# Patient Record
Sex: Male | Born: 1970 | Race: White | Hispanic: No | Marital: Married | Smoking: Never smoker
Health system: Southern US, Community
[De-identification: ages and names within clinical notes are randomized; demographics above are authoritative.]

## PROBLEM LIST (undated history)

## (undated) DIAGNOSIS — Z87442 Personal history of urinary calculi: Secondary | ICD-10-CM

## (undated) DIAGNOSIS — F988 Other specified behavioral and emotional disorders with onset usually occurring in childhood and adolescence: Secondary | ICD-10-CM

## (undated) HISTORY — PX: NO PAST SURGERIES: SHX2092

---

## 2012-03-20 ENCOUNTER — Ambulatory Visit: Payer: Self-pay | Admitting: Family Medicine

## 2013-01-23 ENCOUNTER — Ambulatory Visit: Payer: Self-pay | Admitting: Family Medicine

## 2014-01-07 IMAGING — CT CT CHEST W/O CM
1 series · 16 of 33 positions shown, 20 images · non-contrast
Comparison: none

REASON FOR EXAM: pulmonary nodule Abn CXR
COMMENTS:

PROCEDURE:     AV - AV CHEST WITHOUT CONTRAST  - March 20, 2012 [DATE]
RESULT:     Cecal pulmonary nodule.
Comparison Study: No recent.

[Series 2: soft tissue · axial · 0.66mm/px · z∈[-628,-304]mm · 16 of 118 slices shown, 20 images]
[im 5/118  mediastinal]
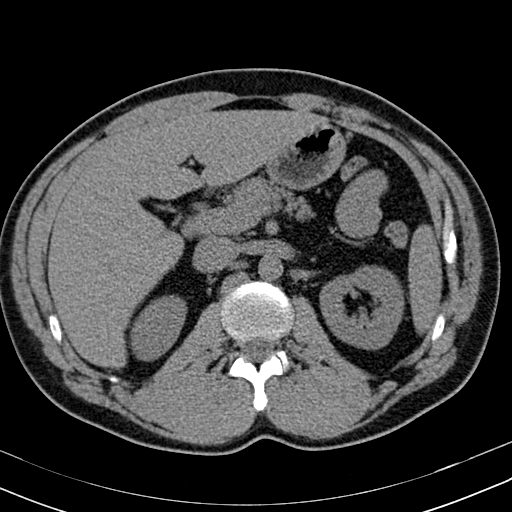
[im 5/118  lung]
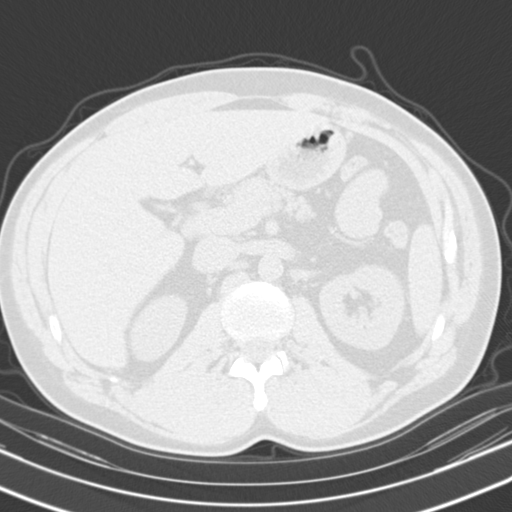
[im 14/118  lung]
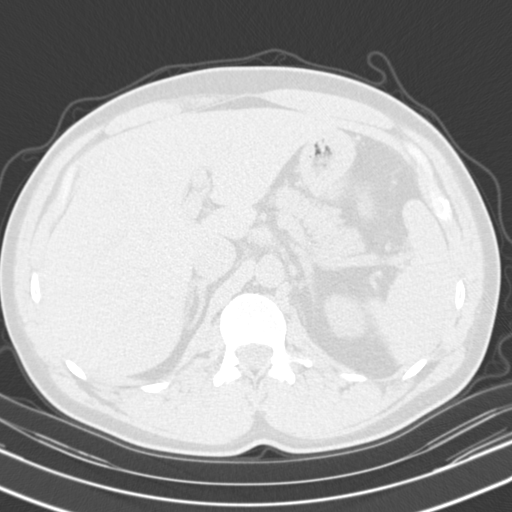
[im 22/118  lung]
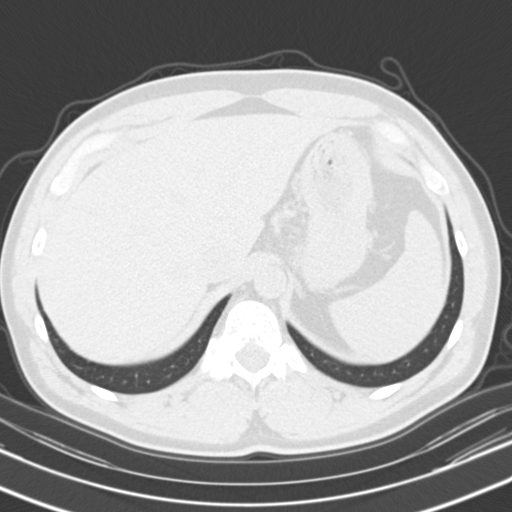
[im 27/118  lung]
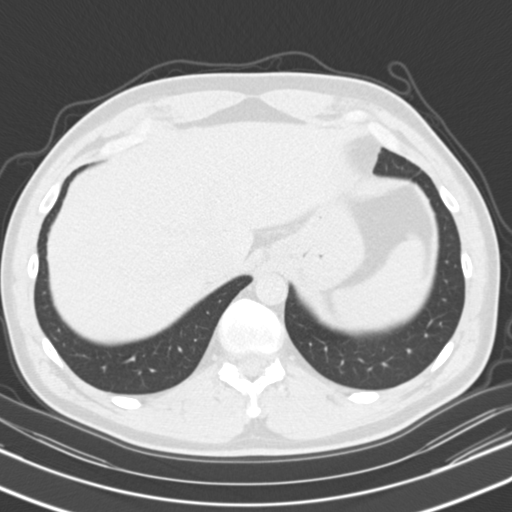
[im 35/118  mediastinal]
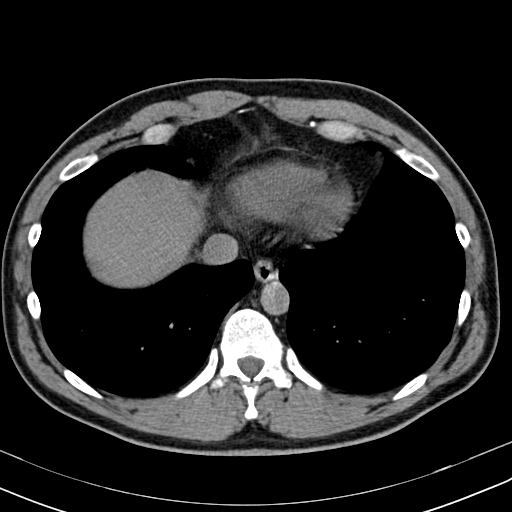
[im 35/118  lung]
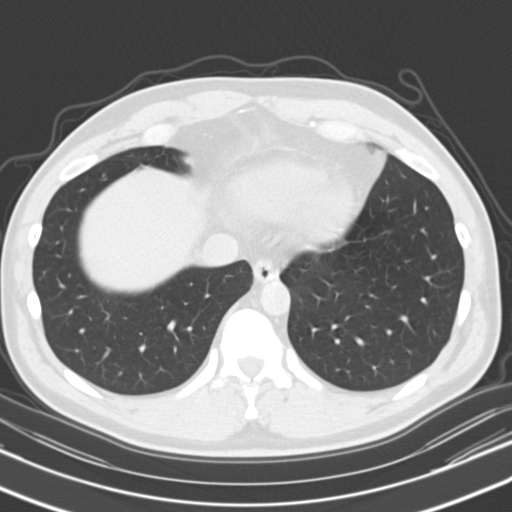
[im 44/118  lung]
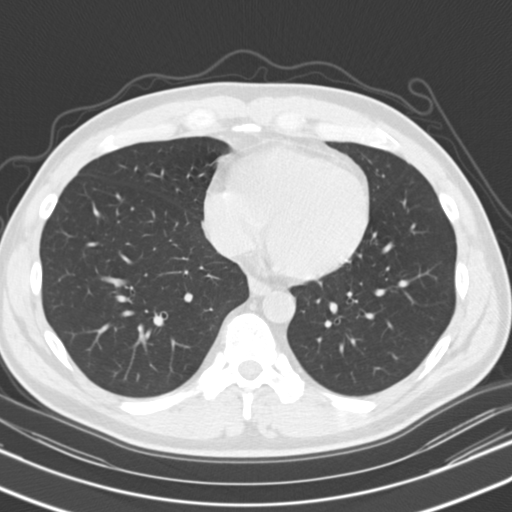
[im 48/118  lung]
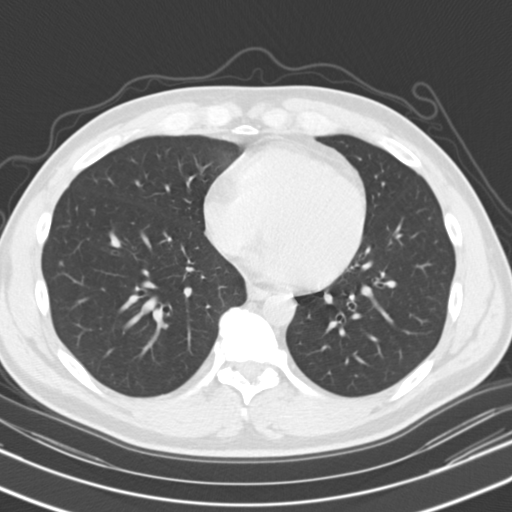
[im 57/118  lung]
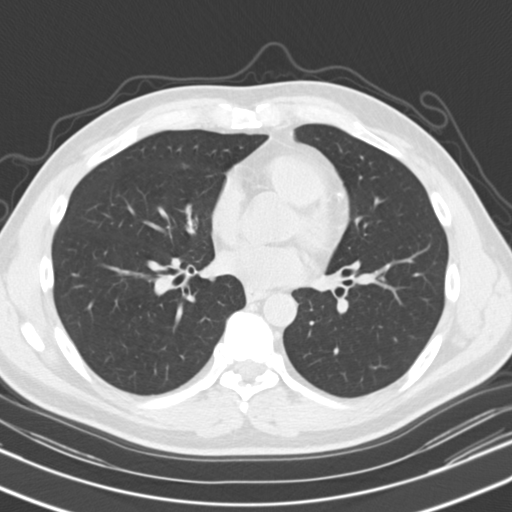
[im 62/118  mediastinal]
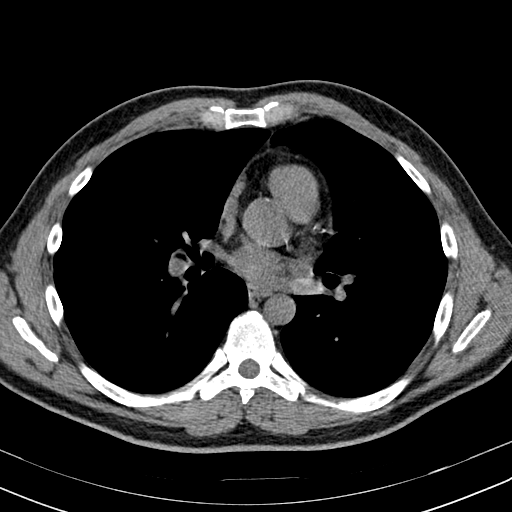
[im 62/118  lung]
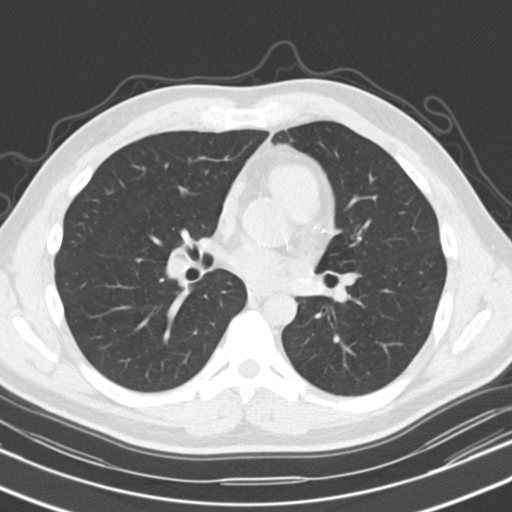
[im 70/118  lung]
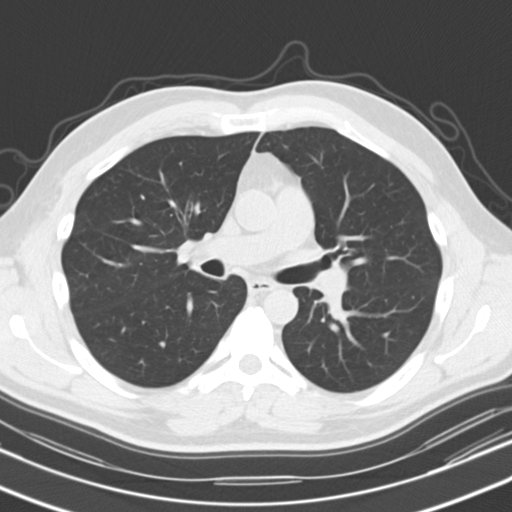
[im 74/118  lung]
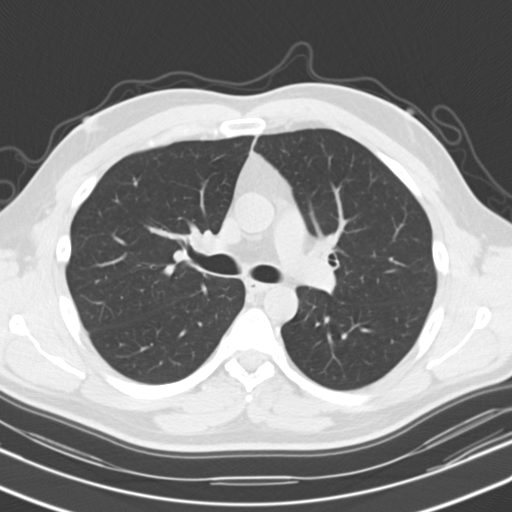
[im 83/118  lung]
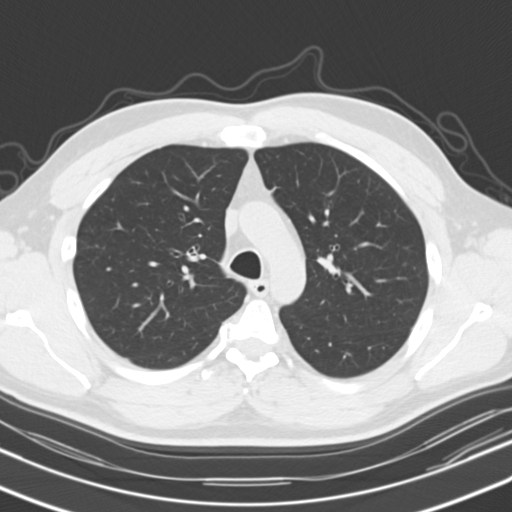
[im 92/118  mediastinal]
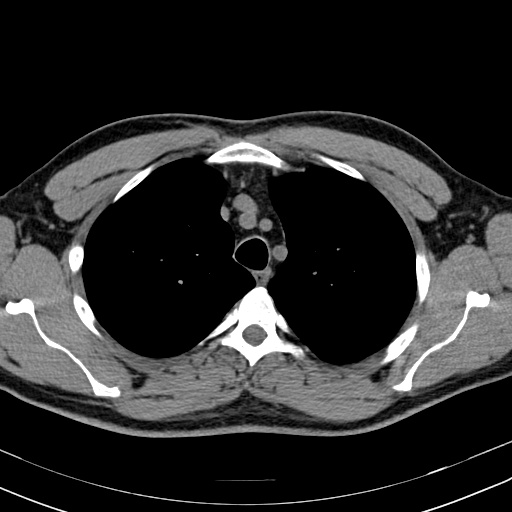
[im 92/118  lung]
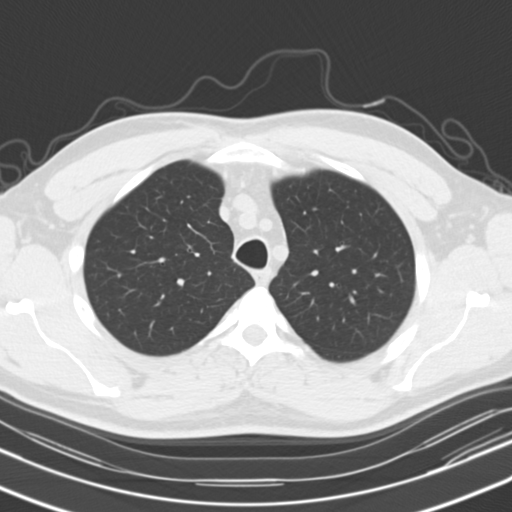
[im 96/118  lung]
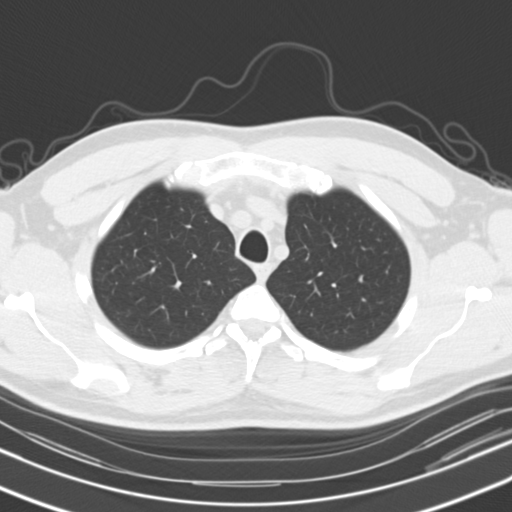
[im 105/118  lung]
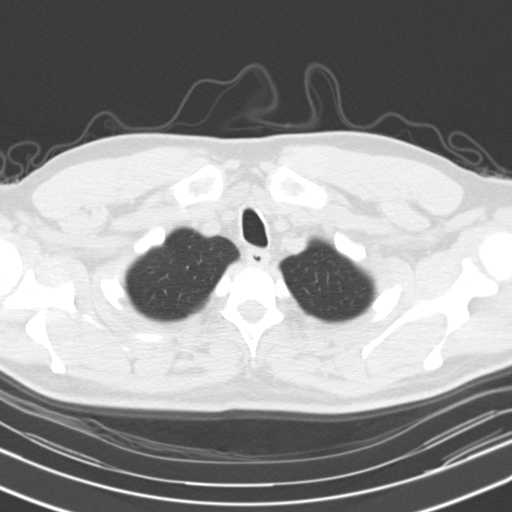
[im 113/118  lung]
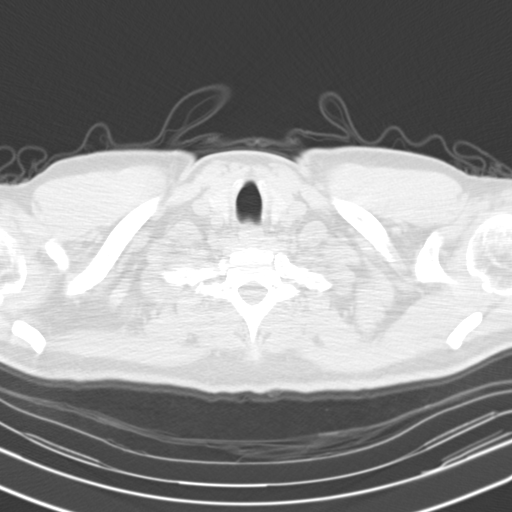

[16 of 33 positions shown; findings below may reference images not displayed]

FINDINGS: Large airways patent. Innumerable 2 mm pulmonary nodules are noted
bilaterally. This is a nonspecific finding. Findings most likely from
granulomas. Process such as metastatic disease cannot be completely excluded
and repeat chest CT is suggested in 3 months. Coronary artery disease.
IMPRESSION: Innumerable bilateral punctate 2 mm pulmonary nodules . No
prominent mass lesion or large pulmonary nodule. These tiny nodules most
likely granulomas. Metastatic disease cannot be completely excluded. For
further evaluation, chest CT in 3 months suggested.

## 2014-06-12 ENCOUNTER — Ambulatory Visit: Payer: Self-pay | Admitting: Registered Nurse

## 2014-06-13 ENCOUNTER — Ambulatory Visit: Payer: Self-pay | Admitting: Registered Nurse

## 2014-11-12 IMAGING — CT CT STONE STUDY
2 of 4 series · 16 of 46 positions shown, 18 images · non-contrast
Comparison: None.

CLINICAL DATA: Microscopic hematuria, history of renal stones

EXAM:
CT ABDOMEN AND PELVIS WITHOUT
TECHNIQUE: Multidetector CT imaging of the abdomen and pelvis was performed
following the standard protocol without IV contrast.

[Series 2: stone · axial · 0.84mm/px · z∈[-1179,-759]mm · 13 of 92 slices shown, 15 images]
[im 4/92  soft-tissue]
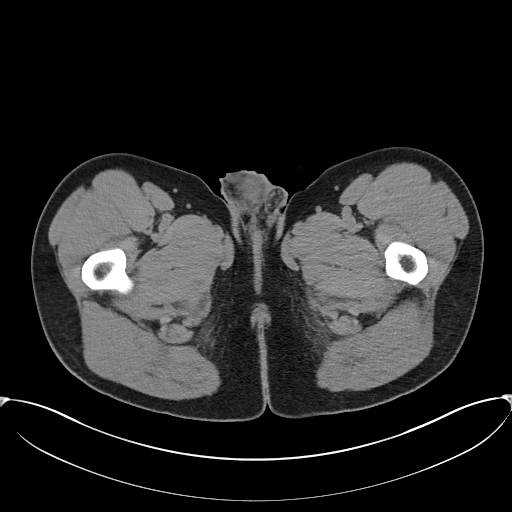
[im 4/92  bone]
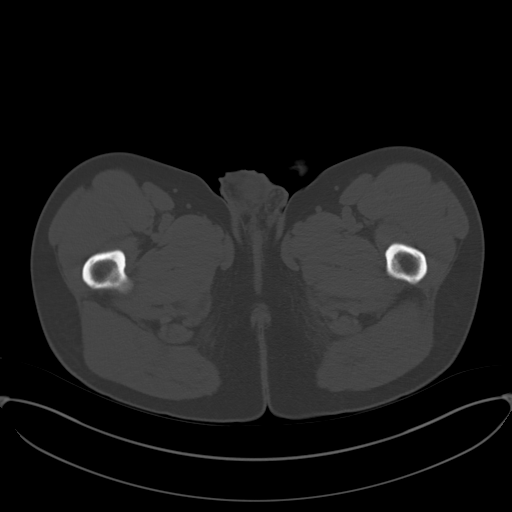
[im 12/92  soft-tissue]
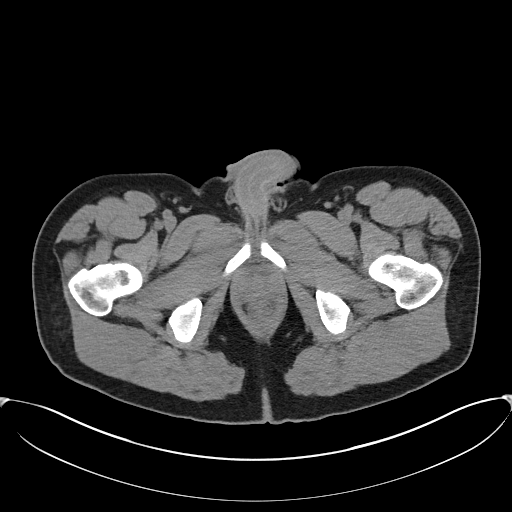
[im 19/92  soft-tissue]
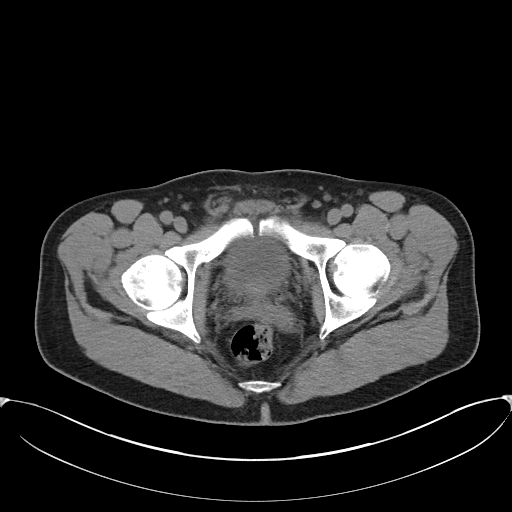
[im 27/92  soft-tissue]
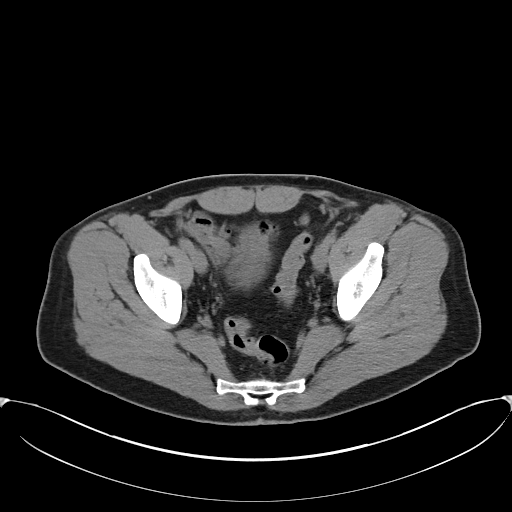
[im 31/92  soft-tissue]
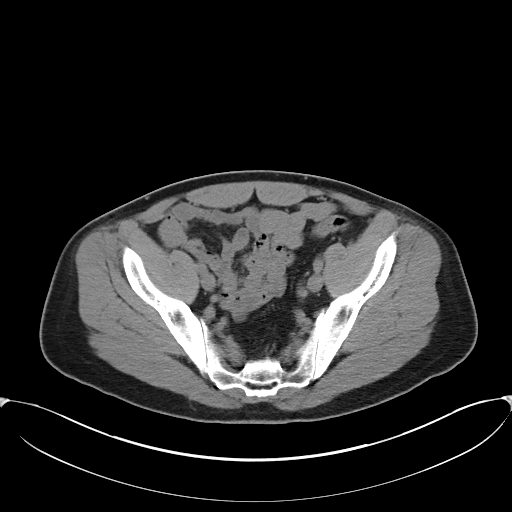
[im 38/92  soft-tissue]
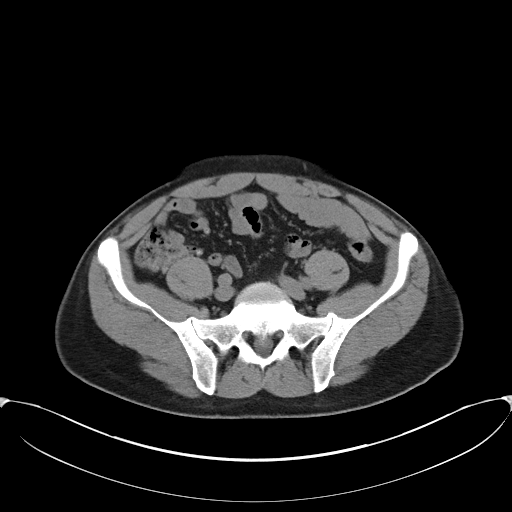
[im 46/92  soft-tissue]
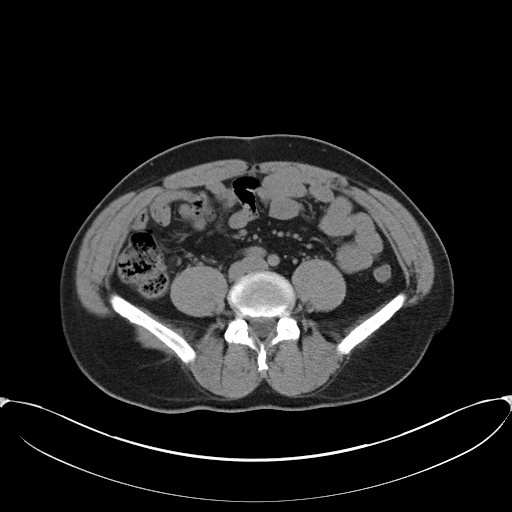
[im 54/92  soft-tissue]
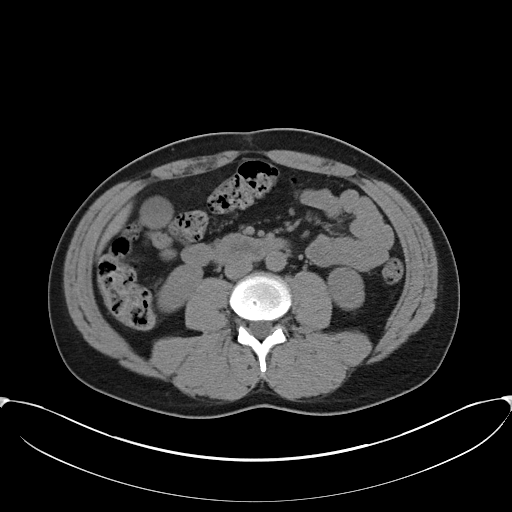
[im 61/92  soft-tissue]
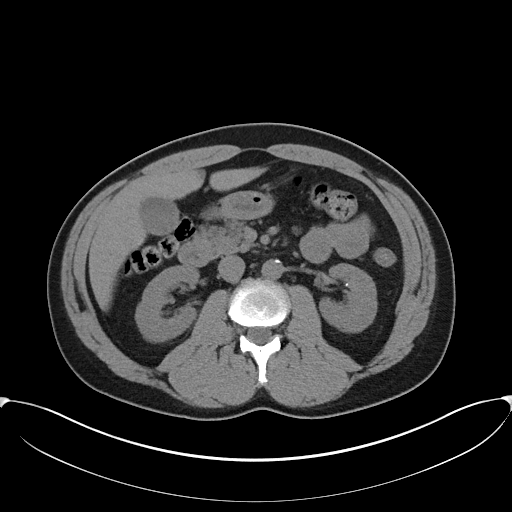
[im 61/92  bone]
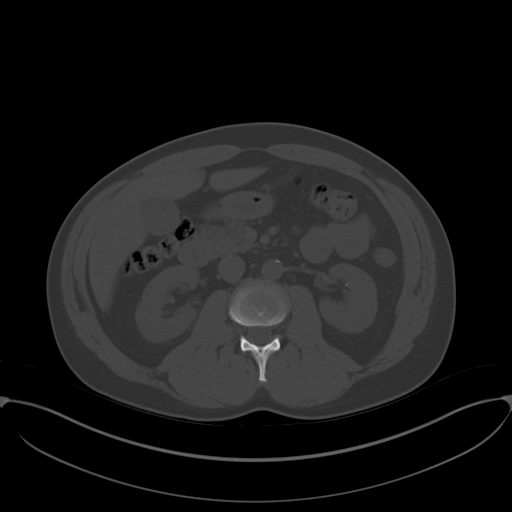
[im 65/92  soft-tissue]
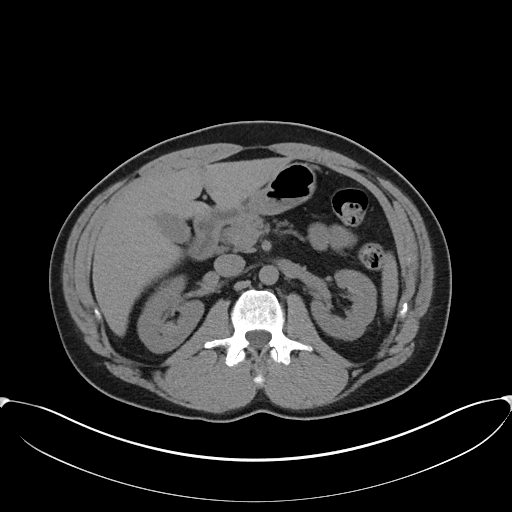
[im 73/92  soft-tissue]
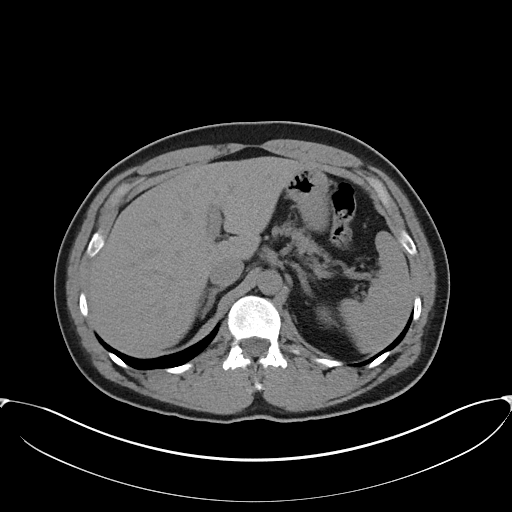
[im 80/92  soft-tissue]
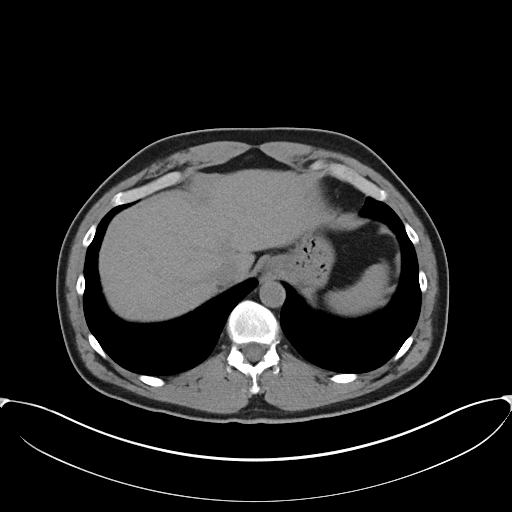
[im 88/92  soft-tissue]
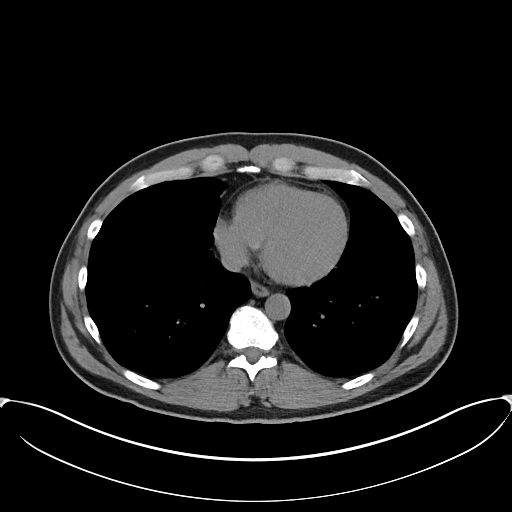

[Series 5: cor · coronal · 0.80mm/px · 3 of 119 slices shown]
[im 40/119  soft-tissue]
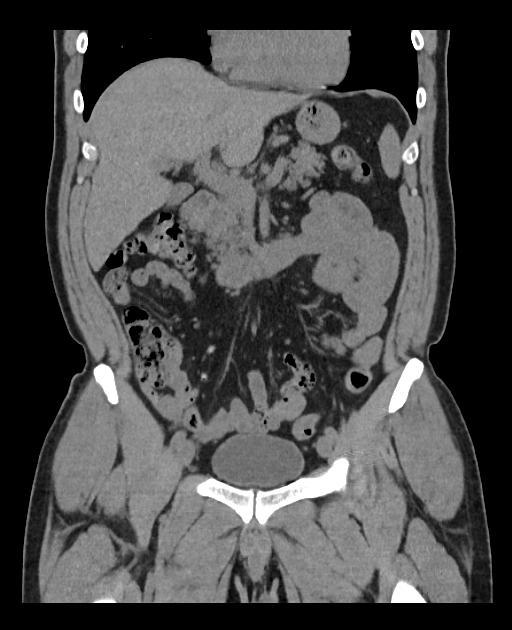
[im 53/119  soft-tissue]
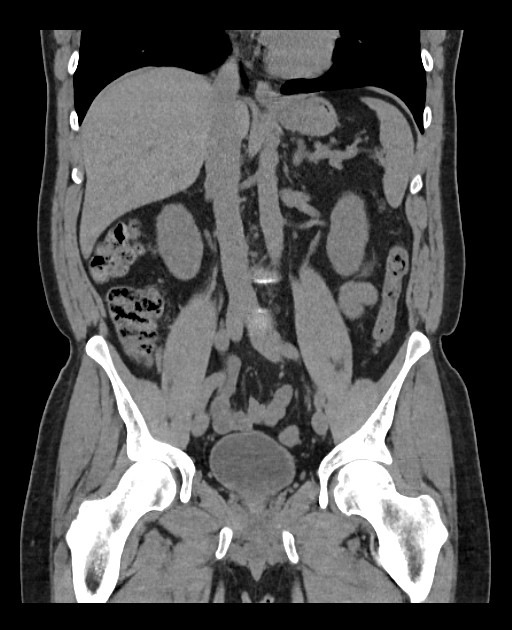
[im 66/119  soft-tissue]
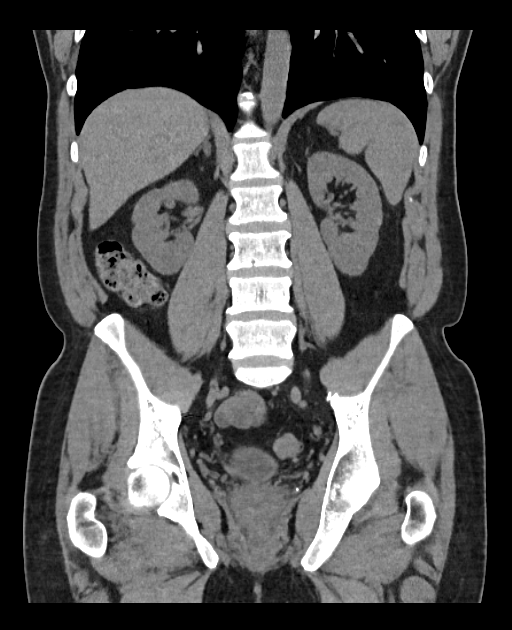

[16 of 46 positions shown; findings below may reference images not displayed]

FINDINGS: Lung bases are clear.

Unenhanced liver is notable for focal fat along the falciform
ligament (Series 2/ image 30).

Unenhanced spleen, pancreas, and adrenal glands are within normal
limits.

Gallbladder is notable for probable layering sludge. No intrahepatic
or extrahepatic ductal dilatation.

Up to 4 nonobstructing left renal calculi measuring 2-3 mm (coronal
images 59, 66, and 69). Right kidney is unremarkable. No
hydronephrosis.

No evidence of bowel obstruction. Normal appendix.

No evidence of abdominal aortic aneurysm.

No abdominopelvic ascites.

No suspicious abdominopelvic lymphadenopathy.

Prostate is unremarkable.

No ureteral or bladder calculi.

Bladder is within normal limits.

Calcified pelvic phleboliths.

Visualized osseous structures are within normal limits.
IMPRESSION: Up to 4 nonobstructing left renal calculi measuring 2-3 mm.

No ureteral or bladder calculi. No hydronephrosis.

## 2015-02-27 ENCOUNTER — Ambulatory Visit
Admission: EM | Admit: 2015-02-27 | Discharge: 2015-02-27 | Disposition: A | Discharge status: Home / Self Care | Payer: Managed Care, Other (non HMO) | Attending: Family Medicine | Admitting: Family Medicine

## 2015-02-27 DIAGNOSIS — Z20818 Contact with and (suspected) exposure to other bacterial communicable diseases: Secondary | ICD-10-CM

## 2015-02-27 DIAGNOSIS — J069 Acute upper respiratory infection, unspecified: Secondary | ICD-10-CM | POA: Diagnosis not present

## 2015-02-27 DIAGNOSIS — Z2089 Contact with and (suspected) exposure to other communicable diseases: Secondary | ICD-10-CM

## 2015-02-27 DIAGNOSIS — J029 Acute pharyngitis, unspecified: Secondary | ICD-10-CM

## 2015-02-27 HISTORY — DX: Personal history of urinary calculi: Z87.442

## 2015-02-27 LAB — RAPID STREP SCREEN (MED CTR MEBANE ONLY): STREPTOCOCCUS, GROUP A SCREEN (DIRECT): NEGATIVE

## 2015-02-27 MED ORDER — AZITHROMYCIN 250 MG PO TABS: ORAL_TABLET | ORAL | Status: DC

## 2015-02-27 MED ORDER — FEXOFENADINE-PSEUDOEPHED ER 180-240 MG PO TB24: 1.0000 | ORAL_TABLET | Freq: Every day | ORAL | Status: DC

## 2015-02-27 NOTE — Discharge Instructions (Signed)
Sore Throat °A sore throat is a painful, burning, sore, or scratchy feeling of the throat. There may be pain or tenderness when swallowing or talking. You may have other symptoms with a sore throat. These include coughing, sneezing, fever, or a swollen neck. A sore throat is often the first sign of another sickness. These sicknesses may include a cold, flu, strep throat, or an infection called mono. Most sore throats go away without medical treatment.  °HOME CARE  °· Only take medicine as told by your doctor. °· Drink enough fluids to keep your pee (urine) clear or pale yellow. °· Rest as needed. °· Try using throat sprays, lozenges, or suck on hard candy (if older than 4 years or as told). °· Sip warm liquids, such as broth, herbal tea, or warm water with honey. Try sucking on frozen ice pops or drinking cold liquids. °· Rinse the mouth (gargle) with salt water. Mix 1 teaspoon salt with 8 ounces of water. °· Do not smoke. Avoid being around others when they are smoking. °· Put a humidifier in your bedroom at night to moisten the air. You can also turn on a hot shower and sit in the bathroom for 5-10 minutes. Be sure the bathroom door is closed. °GET HELP RIGHT AWAY IF:  °· You have trouble breathing. °· You cannot swallow fluids, soft foods, or your spit (saliva). °· You have more puffiness (swelling) in the throat. °· Your sore throat does not get better in 7 days. °· You feel sick to your stomach (nauseous) and throw up (vomit). °· You have a fever or lasting symptoms for more than 2-3 days. °· You have a fever and your symptoms suddenly get worse. °MAKE SURE YOU:  °· Understand these instructions. °· Will watch your condition. °· Will get help right away if you are not doing well or get worse. °  °This information is not intended to replace advice given to you by your health care provider. Make sure you discuss any questions you have with your health care provider. °  °Document Released: 12/13/2007 Document  Revised: 11/28/2011 Document Reviewed: 11/11/2011 °Elsevier Interactive Patient Education ©2016 [employer] ° °Pharyngitis °Pharyngitis is a sore throat (pharynx). There is redness, pain, and swelling of your throat. °HOME CARE  °· Drink enough fluids to keep your pee (urine) clear or pale yellow. °· Only take medicine as told by your doctor. °¨ You may get sick again if you do not take medicine as told. Finish your medicines, even if you start to feel better. °¨ Do not take aspirin. °· Rest. °· Rinse your mouth (gargle) with salt water (½ tsp of salt per 1 qt of water) every 1-2 hours. This will help the pain. °· If you are not at risk for choking, you can suck on hard candy or sore throat lozenges. °GET HELP IF: °· You have large, tender lumps on your neck. °· You have a rash. °· You cough up green, yellow-brown, or bloody spit. °GET HELP RIGHT AWAY IF:  °· You have a stiff neck. °· You drool or cannot swallow liquids. °· You throw up (vomit) or are not able to keep medicine or liquids down. °· You have very bad pain that does not go away with medicine. °· You have problems breathing (not from a stuffy nose). °MAKE SURE YOU:  °· Understand these instructions. °· Will watch your condition. °· Will get help right away if you are not doing well or get worse. °  °  This information is not intended to replace advice given to you by your health care provider. Make sure you discuss any questions you have with your health care provider. °  °Document Released: 08/22/2007 Document Revised: 12/24/2012 Document Reviewed: 11/10/2012 °Elsevier Interactive Patient Education ©2016 [employer] ° °Upper Respiratory Infection, Adult °Most upper respiratory infections (URIs) are caused by a virus. A URI affects the nose, throat, and upper air passages. The most common type of URI is often called "the common cold." °HOME CARE  °· Take medicines only as told by your doctor. °· Gargle warm saltwater or take cough drops to comfort your  throat as told by your doctor. °· Use a warm mist humidifier or inhale steam from a shower to increase air moisture. This may make it easier to breathe. °· Drink enough fluid to keep your pee (urine) clear or pale yellow. °· Eat soups and other clear broths. °· Have a healthy diet. °· Rest as needed. °· Go back to work when your fever is gone or your doctor says it is okay. °¨ You may need to stay home longer to avoid giving your URI to others. °¨ You can also wear a face mask and wash your hands often to prevent spread of the virus. °· Use your inhaler more if you have asthma. °· Do not use any tobacco products, including cigarettes, chewing tobacco, or electronic cigarettes. If you need help quitting, ask your doctor. °GET HELP IF: °· You are getting worse, not better. °· Your symptoms are not helped by medicine. °· You have chills. °· You are getting more short of breath. °· You have brown or red mucus. °· You have yellow or brown discharge from your nose. °· You have pain in your face, especially when you bend forward. °· You have a fever. °· You have puffy (swollen) neck glands. °· You have pain while swallowing. °· You have white areas in the back of your throat. °GET HELP RIGHT AWAY IF:  °· You have very bad or constant: °¨ Headache. °¨ Ear pain. °¨ Pain in your forehead, behind your eyes, and over your cheekbones (sinus pain). °¨ Chest pain. °· You have long-lasting (chronic) lung disease and any of the following: °¨ Wheezing. °¨ Long-lasting cough. °¨ Coughing up blood. °¨ A change in your usual mucus. °· You have a stiff neck. °· You have changes in your: °¨ Vision. °¨ Hearing. °¨ Thinking. °¨ Mood. °MAKE SURE YOU:  °· Understand these instructions. °· Will watch your condition. °· Will get help right away if you are not doing well or get worse. °  °This information is not intended to replace advice given to you by your health care provider. Make sure you discuss any questions you have with your health  care provider. °  °Document Released: 08/22/2007 Document Revised: 07/20/2014 Document Reviewed: 06/10/2013 °Elsevier Interactive Patient Education ©2016 [employer] Inc. ° °

## 2015-02-27 NOTE — ED Notes (Signed)
Patient complains of sore throat and swollen tonsils. Daughter is positive for strep and is also being seen today. He would like to be evaluated. Patient states that symptoms have been occuring for 2 days. Patient states that he has had body aches and a cough

## 2015-02-27 NOTE — ED Provider Notes (Signed)
CSN: [id number]     Arrival date & time 02/27/15  1005 History   First MD Initiated Contact with Patient 02/27/15 1016    Nurses notes were reviewed. Chief Complaint  Patient presents with  . Sore Throat   Father reports having sore throat. He has also had URI symptoms since Friday or 2 days. Probably the most important thing is his daughter is now sick and she has a positive strep test. He denies smoking or any significant medical problems   (Consider location/radiation/quality/duration/timing/severity/associated sxs/prior Treatment) Patient is a 44 y.o. male presenting with pharyngitis. The history is provided by the patient. No language interpreter was used.  Sore Throat This is a new problem. The current episode started 2 days ago. The problem occurs constantly. The problem has been gradually worsening. Pertinent negatives include no chest pain, no abdominal pain, no headaches and no shortness of breath. Nothing relieves the symptoms. The treatment provided no relief.    Past Medical History  Diagnosis Date  . History of kidney stones    Past Surgical History  Procedure Laterality Date  . No past surgeries     History reviewed. No pertinent family history. Social History  Substance Use Topics  . Smoking status: Never Smoker   . Smokeless tobacco: None  . Alcohol Use: No    Review of Systems  HENT: Positive for congestion, postnasal drip and sore throat. Negative for trouble swallowing and voice change.   Respiratory: Negative for shortness of breath.   Cardiovascular: Negative for chest pain.  Gastrointestinal: Negative for abdominal pain.  Neurological: Negative.  Negative for headaches.  All other systems reviewed and are negative.   Allergies  Penicillins  Home Medications   Prior to Admission medications   Medication Sig Start Date End Date Taking? Authorizing Provider  amphetamine-dextroamphetamine (ADDERALL) 15 MG tablet Take 15 mg by mouth 2 (two) times  daily.   Yes Historical Provider, MD  clonazePAM (KLONOPIN) 0.5 MG tablet Take 0.5 mg by mouth 2 (two) times daily as needed for anxiety.   Yes Historical Provider, MD  azithromycin (ZITHROMAX Z-PAK) 250 MG tablet Take 2 tablets first day and then 1 po a day for 4 days 02/27/15   Hassan RowanEugene Averyanna Sax, MD  fexofenadine-pseudoephedrine (ALLEGRA-D ALLERGY & CONGESTION) 180-240 MG 24 hr tablet Take 1 tablet by mouth daily. 02/27/15   Hassan RowanEugene Marcelo Ickes, MD   Meds Ordered and Administered this Visit  Medications - No data to display  BP 115/84 mmHg  Pulse 69  Temp(Src) 97.6 F (36.4 C) (Tympanic)  Resp 16  Ht 5\' 10"  (1.778 m)  Wt 174 lb (78.926 kg)  BMI 24.97 kg/m2  SpO2 100% No data found.   Physical Exam  Constitutional: He appears well-developed and well-nourished.  HENT:  Head: Normocephalic and atraumatic.  Right Ear: Tympanic membrane, external ear and ear canal normal.  Left Ear: Tympanic membrane, external ear and ear canal normal.  Nose: Mucosal edema and rhinorrhea present. Right sinus exhibits no maxillary sinus tenderness. Left sinus exhibits no maxillary sinus tenderness.  Mouth/Throat: He does not have dentures. Normal dentition. No dental caries. Posterior oropharyngeal erythema present. No posterior oropharyngeal edema.    ED Course  Procedures (including critical care time)  Labs Review Labs Reviewed  RAPID STREP SCREEN (NOT AT [facility])  CULTURE, GROUP A STREP ([facility] ONLY)    Imaging Review No results found.   Visual Acuity Review  Right Eye Distance:   Left Eye Distance:   Bilateral Distance:  Right Eye Near:   Left Eye Near:    Bilateral Near:         MDM   1. Acute pharyngitis, unspecified pharyngitis type   2. URI, acute   3. Strep throat exposure    patient is allergic to penicillin. We'll place him on Zithromax Z-PAK Allegra-D to use as needed work note for tomorrow strep culture obtained assume will be positive since his daughter is  positive.    Hassan Rowan, MD 02/27/15 [phone]

## 2015-03-01 LAB — CULTURE, GROUP A STREP (THRC)

## 2018-05-25 ENCOUNTER — Ambulatory Visit
Admission: EM | Admit: 2018-05-25 | Discharge: 2018-05-25 | Disposition: A | Discharge status: Home / Self Care | Payer: 59 | Attending: Emergency Medicine | Admitting: Emergency Medicine

## 2018-05-25 ENCOUNTER — Other Ambulatory Visit: Payer: Self-pay

## 2018-05-25 DIAGNOSIS — R69 Illness, unspecified: Secondary | ICD-10-CM

## 2018-05-25 DIAGNOSIS — M791 Myalgia, unspecified site: Secondary | ICD-10-CM | POA: Diagnosis not present

## 2018-05-25 DIAGNOSIS — R05 Cough: Secondary | ICD-10-CM

## 2018-05-25 DIAGNOSIS — R6883 Chills (without fever): Secondary | ICD-10-CM

## 2018-05-25 DIAGNOSIS — J111 Influenza due to unidentified influenza virus with other respiratory manifestations: Secondary | ICD-10-CM

## 2018-05-25 HISTORY — DX: Other specified behavioral and emotional disorders with onset usually occurring in childhood and adolescence: F98.8

## 2018-05-25 MED ORDER — OSELTAMIVIR PHOSPHATE 75 MG PO CAPS: 75.0000 mg | ORAL_CAPSULE | Freq: Two times a day (BID) | ORAL | 0 refills | Status: AC

## 2018-05-25 MED ORDER — BENZONATATE 200 MG PO CAPS: ORAL_CAPSULE | ORAL | 0 refills | Status: AC

## 2018-05-25 MED ORDER — HYDROCOD POLST-CPM POLST ER 10-8 MG/5ML PO SUER: 5.0000 mL | Freq: Two times a day (BID) | ORAL | 0 refills | Status: AC

## 2018-05-25 NOTE — ED Provider Notes (Signed)
[facility]    CSN: [id number] Arrival date & time: 05/25/18  5784     History   Chief Complaint Chief Complaint  Patient presents with  . Chills  . Generalized Body Aches    HPI Brent Serrano is a 48 y.o. male.   HPI  48 year old male presents with chills coughing and body aches.  His daughter was here yesterday and diagnosed with influenza A.  Did receive his flu shot this year.  Temperature today is 99 pulse rate 91 respirations 16 O2 sats 100%.      Past Medical History:  Diagnosis Date  . ADD (attention deficit disorder)   . History of kidney stones     There are no active problems to display for this patient.   Past Surgical History:  Procedure Laterality Date  . NO PAST SURGERIES         Home Medications    Prior to Admission medications   Medication Sig Start Date End Date Taking? Authorizing Provider  amphetamine-dextroamphetamine (ADDERALL) 15 MG tablet Take 15 mg by mouth 2 (two) times daily.    [provider]  benzonatate (TESSALON) 200 MG capsule Take one cap TID PRN cough 05/25/18   Lutricia Feil, PA-C  chlorpheniramine-HYDROcodone [facility] ER) 10-8 MG/5ML SUER Take 5 mLs by mouth 2 (two) times daily. 05/25/18   Lutricia Feil, PA-C  oseltamivir (TAMIFLU) 75 MG capsule Take 1 capsule (75 mg total) by mouth every 12 (twelve) hours. 05/25/18   Lutricia Feil, PA-C    Family History History reviewed. No pertinent family history.  Social History Social History   Tobacco Use  . Smoking status: Never Smoker  . Smokeless tobacco: Never Used  Substance Use Topics  . Alcohol use: No    Alcohol/week: 0.0 standard drinks  . Drug use: No     Allergies   Penicillins   Review of Systems Review of Systems  Constitutional: Positive for activity change, chills and fatigue. Negative for fever.  HENT: Positive for congestion and postnasal drip.   Respiratory: Positive for cough.   All other systems  reviewed and are negative.    Physical Exam Triage Vital Signs ED Triage Vitals [05/25/18 0936]  Enc Vitals Group     BP (!) 139/96     Pulse Rate 91     Resp 16     Temp 99 F (37.2 C)     Temp Source Oral     SpO2 100 %     Weight 180 lb (81.6 kg)     Height 5\' 10"  (1.778 m)     Head Circumference      Peak Flow      Pain Score 4     Pain Loc      Pain Edu?      Excl. in GC?    No data found.  Updated Vital Signs BP (!) 139/96 (BP Location: Left Arm)   Pulse 91   Temp 99 F (37.2 C) (Oral)   Resp 16   Ht 5\' 10"  (1.778 m)   Wt 180 lb (81.6 kg)   SpO2 100%   BMI 25.83 kg/m   Visual Acuity Right Eye Distance:   Left Eye Distance:   Bilateral Distance:    Right Eye Near:   Left Eye Near:    Bilateral Near:     Physical Exam Vitals signs and nursing note reviewed.  Constitutional:      General: He is not  in acute distress.    Appearance: Normal appearance. He is normal weight. He is not ill-appearing, toxic-appearing or diaphoretic.  HENT:     Head: Normocephalic.     Right Ear: Tympanic membrane, ear canal and external ear normal.     Left Ear: Tympanic membrane, ear canal and external ear normal.     Nose: Congestion and rhinorrhea present.     Mouth/Throat:     Mouth: Mucous membranes are moist.     Pharynx: Oropharynx is clear. No oropharyngeal exudate or posterior oropharyngeal erythema.  Eyes:     General:        Right eye: No discharge.        Left eye: No discharge.     Conjunctiva/sclera: Conjunctivae normal.  Neck:     Musculoskeletal: Normal range of motion and neck supple.  Pulmonary:     Effort: Pulmonary effort is normal.     Breath sounds: Normal breath sounds.  Musculoskeletal: Normal range of motion.  Skin:    General: Skin is warm and dry.  Neurological:     General: No focal deficit present.     Mental Status: He is alert and oriented to person, place, and time.  Psychiatric:        Mood and Affect: Mood normal.         Behavior: Behavior normal.        Thought Content: Thought content normal.        Judgment: Judgment normal.      UC Treatments / Results  Labs (all labs ordered are listed, but only abnormal results are displayed) Labs Reviewed - No data to display  EKG None  Radiology No results found.  Procedures Procedures (including critical care time)  Medications Ordered in UC Medications - No data to display  Initial Impression / Assessment and Plan / UC Course  I have reviewed the triage vital signs and the nursing notes.  Pertinent labs & imaging results that were available during my care of the patient were reviewed by me and considered in my medical decision making (see chart for details).   Presents with influenza-like illness after his daughter was diagnosed with influenza A yesterday.  Placed him on Tamiflu Tussionex and Tessalon Perles.  He is not improving or worsen she should return to our clinic or go to the emergency room   Final Clinical Impressions(s) / UC Diagnoses   Final diagnoses:  Influenza-like illness     Discharge Instructions     Drink plenty of fluids.  Rest as much as possible.  Use Tylenol or Motrin for fever chills or body aches.    ED Prescriptions    Medication Sig Dispense Auth. Provider   benzonatate (TESSALON) 200 MG capsule Take one cap TID PRN cough 30 capsule Lutricia Feil, PA-C   oseltamivir (TAMIFLU) 75 MG capsule Take 1 capsule (75 mg total) by mouth every 12 (twelve) hours. 10 capsule Lutricia Feil, PA-C   chlorpheniramine-HYDROcodone (TUSSIONEX PENNKINETIC ER) 10-8 MG/5ML SUER Take 5 mLs by mouth 2 (two) times daily. 115 mL Lutricia Feil, PA-C     Controlled Substance Prescriptions [place] Controlled Substance Registry consulted? Not Applicable   Lutricia Feil, PA-C 05/25/18 6144

## 2018-05-25 NOTE — ED Triage Notes (Signed)
Pt states his daughter has the flu and he started last night with chills and "fever pains."

## 2018-05-25 NOTE — Discharge Instructions (Addendum)
Drink plenty of fluids.  Rest as much as possible.  Use Tylenol or Motrin for fever chills or body aches.  °

## 2019-06-14 ENCOUNTER — Ambulatory Visit: Payer: 59 | Attending: Internal Medicine

## 2019-06-14 DIAGNOSIS — Z23 Encounter for immunization: Secondary | ICD-10-CM

## 2019-06-14 NOTE — Progress Notes (Signed)
   Covid-19 Vaccination Clinic  Name:  Brent Serrano    MRN: [id number] DOB: 04-Jun-1970  06/14/2019  [name] was observed post Covid-19 immunization for 15 minutes without incident. He was provided with Vaccine Information Sheet and instruction to access the V-Safe system.   [name] was instructed to call 911 with any severe reactions post vaccine: Marland Kitchen Difficulty breathing  . Swelling of face and throat  . A fast heartbeat  . A bad rash all over body  . Dizziness and weakness   Immunizations Administered    Name Date Dose VIS Date Route   Pfizer COVID-19 Vaccine 06/14/2019 12:58 PM 0.3 mL 02/27/2019 Intramuscular   Manufacturer: [employer], [employer]   Lot: [id number]   NDC: [address]-3

## 2019-07-07 ENCOUNTER — Ambulatory Visit: Payer: 59 | Attending: Internal Medicine

## 2019-07-07 DIAGNOSIS — Z23 Encounter for immunization: Secondary | ICD-10-CM

## 2019-07-07 NOTE — Progress Notes (Signed)
   Covid-19 Vaccination Clinic  Name:  Brent Serrano    MRN: [id number] DOB: Nov 09, 1970  07/07/2019  Brent Serrano was observed post Covid-19 immunization for 15 minutes without incident. He was provided with Vaccine Information Sheet and instruction to access the V-Safe system.   Brent Serrano was instructed to call 911 with any severe reactions post vaccine: Marland Kitchen Difficulty breathing  . Swelling of face and throat  . A fast heartbeat  . A bad rash all over body  . Dizziness and weakness   Immunizations Administered    Name Date Dose VIS Date Route   Pfizer COVID-19 Vaccine 07/07/2019  1:50 PM 0.3 mL 05/13/2018 Intramuscular   Manufacturer: [employer], [employer]   Lot: [id number]   NDC: [address]-7
